# Patient Record
Sex: Female | Born: 1960 | Hispanic: No | Smoking: Never smoker
Health system: Southern US, Community
[De-identification: ages and names within clinical notes are randomized; demographics above are authoritative.]

## PROBLEM LIST (undated history)

## (undated) DIAGNOSIS — R7303 Prediabetes: Secondary | ICD-10-CM

## (undated) DIAGNOSIS — K219 Gastro-esophageal reflux disease without esophagitis: Secondary | ICD-10-CM

## (undated) DIAGNOSIS — D259 Leiomyoma of uterus, unspecified: Secondary | ICD-10-CM

## (undated) DIAGNOSIS — N950 Postmenopausal bleeding: Secondary | ICD-10-CM

## (undated) DIAGNOSIS — Z9229 Personal history of other drug therapy: Secondary | ICD-10-CM

## (undated) HISTORY — DX: Prediabetes: R73.03

## (undated) HISTORY — PX: COLONOSCOPY: SHX174

## (undated) HISTORY — DX: Postmenopausal bleeding: N95.0

## (undated) HISTORY — DX: Personal history of other drug therapy: Z92.29

## (undated) HISTORY — PX: OTHER SURGICAL HISTORY: SHX169

## (undated) HISTORY — PX: APPENDECTOMY: SHX54

## (undated) HISTORY — DX: Leiomyoma of uterus, unspecified: D25.9

---

## 2000-04-14 ENCOUNTER — Other Ambulatory Visit: Admission: RE | Admit: 2000-04-14 | Discharge: 2000-04-14 | Payer: Self-pay | Admitting: Obstetrics & Gynecology

## 2001-08-20 ENCOUNTER — Other Ambulatory Visit: Admission: RE | Admit: 2001-08-20 | Discharge: 2001-08-20 | Payer: Self-pay | Admitting: Obstetrics & Gynecology

## 2002-10-25 ENCOUNTER — Other Ambulatory Visit: Admission: RE | Admit: 2002-10-25 | Discharge: 2002-10-25 | Payer: Self-pay | Admitting: Obstetrics & Gynecology

## 2003-10-28 ENCOUNTER — Other Ambulatory Visit: Admission: RE | Admit: 2003-10-28 | Discharge: 2003-10-28 | Payer: Self-pay | Admitting: Obstetrics & Gynecology

## 2005-01-11 ENCOUNTER — Other Ambulatory Visit: Admission: RE | Admit: 2005-01-11 | Discharge: 2005-01-11 | Payer: Self-pay | Admitting: Obstetrics & Gynecology

## 2014-05-05 ENCOUNTER — Encounter (HOSPITAL_COMMUNITY): Payer: Self-pay | Admitting: *Deleted

## 2014-05-06 ENCOUNTER — Other Ambulatory Visit: Payer: Self-pay | Admitting: Obstetrics and Gynecology

## 2014-05-08 ENCOUNTER — Encounter (HOSPITAL_COMMUNITY): Payer: BC Managed Care – PPO | Admitting: Anesthesiology

## 2014-05-08 ENCOUNTER — Ambulatory Visit (HOSPITAL_COMMUNITY)
Admission: RE | Admit: 2014-05-08 | Discharge: 2014-05-08 | Disposition: A | Discharge status: Home / Self Care | Payer: BC Managed Care – PPO | Source: Ambulatory Visit | Attending: Obstetrics and Gynecology | Admitting: Obstetrics and Gynecology

## 2014-05-08 ENCOUNTER — Encounter (HOSPITAL_COMMUNITY): Payer: Self-pay | Admitting: *Deleted

## 2014-05-08 ENCOUNTER — Encounter (HOSPITAL_COMMUNITY)
Admission: RE | Disposition: A | Discharge status: Home / Self Care | Payer: Self-pay | Source: Ambulatory Visit | Attending: Obstetrics and Gynecology

## 2014-05-08 ENCOUNTER — Ambulatory Visit (HOSPITAL_COMMUNITY): Payer: BC Managed Care – PPO | Admitting: Anesthesiology

## 2014-05-08 DIAGNOSIS — K219 Gastro-esophageal reflux disease without esophagitis: Secondary | ICD-10-CM | POA: Insufficient documentation

## 2014-05-08 DIAGNOSIS — R05 Cough: Secondary | ICD-10-CM | POA: Insufficient documentation

## 2014-05-08 DIAGNOSIS — R059 Cough, unspecified: Secondary | ICD-10-CM | POA: Insufficient documentation

## 2014-05-08 DIAGNOSIS — N950 Postmenopausal bleeding: Secondary | ICD-10-CM | POA: Insufficient documentation

## 2014-05-08 DIAGNOSIS — D259 Leiomyoma of uterus, unspecified: Secondary | ICD-10-CM

## 2014-05-08 DIAGNOSIS — N882 Stricture and stenosis of cervix uteri: Secondary | ICD-10-CM | POA: Insufficient documentation

## 2014-05-08 HISTORY — DX: Gastro-esophageal reflux disease without esophagitis: K21.9

## 2014-05-08 HISTORY — PX: HYSTEROSCOPY W/D&C: SHX1775

## 2014-05-08 LAB — CBC
HEMATOCRIT: 39.6 % (ref 36.0–46.0)
Hemoglobin: 13 g/dL (ref 12.0–15.0)
MCH: 30.4 pg (ref 26.0–34.0)
MCHC: 32.8 g/dL (ref 30.0–36.0)
MCV: 92.5 fL (ref 78.0–100.0)
Platelets: 278 10*3/uL (ref 150–400)
RBC: 4.28 MIL/uL (ref 3.87–5.11)
RDW: 12.1 % (ref 11.5–15.5)
WBC: 6.6 10*3/uL (ref 4.0–10.5)

## 2014-05-08 SURGERY — DILATATION AND CURETTAGE /HYSTEROSCOPY
Anesthesia: General

## 2014-05-08 MED ORDER — OXYCODONE HCL 5 MG/5ML PO SOLN: 5.0000 mg | Freq: Once | ORAL | Status: DC | PRN

## 2014-05-08 MED ORDER — LACTATED RINGERS IV SOLN
INTRAVENOUS | Status: DC
  Administered 2014-05-08 (×2): via INTRAVENOUS

## 2014-05-08 MED ORDER — MIDAZOLAM HCL 2 MG/2ML IJ SOLN
INTRAMUSCULAR | Status: DC | PRN
  Administered 2014-05-08: 2 mg via INTRAVENOUS

## 2014-05-08 MED ORDER — KETOROLAC TROMETHAMINE 30 MG/ML IJ SOLN
INTRAMUSCULAR | Status: DC | PRN
  Administered 2014-05-08: 30 mg via INTRAVENOUS

## 2014-05-08 MED ORDER — ONDANSETRON HCL 4 MG/2ML IJ SOLN
INTRAMUSCULAR | Status: DC | PRN
  Administered 2014-05-08: 4 mg via INTRAVENOUS

## 2014-05-08 MED ORDER — OXYCODONE HCL 5 MG PO TABS: 5.0000 mg | ORAL_TABLET | Freq: Once | ORAL | Status: DC | PRN

## 2014-05-08 MED ORDER — ACETAMINOPHEN 325 MG PO TABS: 325.0000 mg | ORAL_TABLET | ORAL | Status: DC | PRN

## 2014-05-08 MED ORDER — LIDOCAINE HCL (CARDIAC) 20 MG/ML IV SOLN
INTRAVENOUS | Status: DC | PRN
  Administered 2014-05-08: 80 mg via INTRAVENOUS

## 2014-05-08 MED ORDER — PROPOFOL 10 MG/ML IV EMUL
INTRAVENOUS | Status: AC
  Filled 2014-05-08: qty 20

## 2014-05-08 MED ORDER — MIDAZOLAM HCL 2 MG/2ML IJ SOLN
INTRAMUSCULAR | Status: AC
  Filled 2014-05-08: qty 2

## 2014-05-08 MED ORDER — GLYCINE 1.5 % IR SOLN
Status: DC | PRN
  Administered 2014-05-08: 3000 mL

## 2014-05-08 MED ORDER — KETOROLAC TROMETHAMINE 30 MG/ML IJ SOLN
INTRAMUSCULAR | Status: AC
  Filled 2014-05-08: qty 1

## 2014-05-08 MED ORDER — FENTANYL CITRATE 0.05 MG/ML IJ SOLN: 25.0000 ug | INTRAMUSCULAR | Status: DC | PRN

## 2014-05-08 MED ORDER — ACETAMINOPHEN 160 MG/5ML PO SOLN: 325.0000 mg | ORAL | Status: DC | PRN

## 2014-05-08 MED ORDER — FENTANYL CITRATE 0.05 MG/ML IJ SOLN
INTRAMUSCULAR | Status: DC | PRN
  Administered 2014-05-08 (×2): 50 ug via INTRAVENOUS

## 2014-05-08 MED ORDER — DEXAMETHASONE SODIUM PHOSPHATE 10 MG/ML IJ SOLN
INTRAMUSCULAR | Status: AC
  Filled 2014-05-08: qty 1

## 2014-05-08 MED ORDER — ONDANSETRON HCL 4 MG/2ML IJ SOLN
INTRAMUSCULAR | Status: AC
  Filled 2014-05-08: qty 2

## 2014-05-08 MED ORDER — DEXAMETHASONE SODIUM PHOSPHATE 10 MG/ML IJ SOLN
INTRAMUSCULAR | Status: DC | PRN
  Administered 2014-05-08: 10 mg via INTRAVENOUS

## 2014-05-08 MED ORDER — ONDANSETRON HCL 4 MG/2ML IJ SOLN: 4.0000 mg | Freq: Once | INTRAMUSCULAR | Status: DC | PRN

## 2014-05-08 MED ORDER — LIDOCAINE HCL 1 % IJ SOLN
INTRAMUSCULAR | Status: AC
  Filled 2014-05-08: qty 20

## 2014-05-08 MED ORDER — HYDROCODONE-IBUPROFEN 5-200 MG PO TABS: 1.0000 | ORAL_TABLET | Freq: Three times a day (TID) | ORAL | Status: AC | PRN

## 2014-05-08 MED ORDER — FENTANYL CITRATE 0.05 MG/ML IJ SOLN
INTRAMUSCULAR | Status: AC
  Filled 2014-05-08: qty 2

## 2014-05-08 MED ORDER — LIDOCAINE HCL (CARDIAC) 20 MG/ML IV SOLN
INTRAVENOUS | Status: AC
  Filled 2014-05-08: qty 5

## 2014-05-08 MED ORDER — CEFAZOLIN SODIUM-DEXTROSE 2-3 GM-% IV SOLR
INTRAVENOUS | Status: AC
  Filled 2014-05-08: qty 50

## 2014-05-08 MED ORDER — CEFAZOLIN SODIUM-DEXTROSE 2-3 GM-% IV SOLR: 2.0000 g | Freq: Once | INTRAVENOUS | Status: DC

## 2014-05-08 MED ORDER — PROPOFOL 10 MG/ML IV BOLUS
INTRAVENOUS | Status: DC | PRN
  Administered 2014-05-08: 100 mg via INTRAVENOUS

## 2014-05-08 SURGICAL SUPPLY — 16 items
CANISTER SUCT 3000ML (MISCELLANEOUS) ×3 IMPLANT
CATH ROBINSON RED A/P 16FR (CATHETERS) ×3 IMPLANT
CLOTH BEACON ORANGE TIMEOUT ST (SAFETY) ×3 IMPLANT
CONTAINER PREFILL 10% NBF 60ML (FORM) ×6 IMPLANT
DRAPE HYSTEROSCOPY (DRAPE) ×3 IMPLANT
ELECT REM PT RETURN 9FT ADLT (ELECTROSURGICAL) ×3
ELECTRODE REM PT RTRN 9FT ADLT (ELECTROSURGICAL) ×1 IMPLANT
GLOVE ECLIPSE 7.0 STRL STRAW (GLOVE) ×6 IMPLANT
GOWN STRL REUS W/TWL LRG LVL3 (GOWN DISPOSABLE) ×9 IMPLANT
LOOP ANGLED CUTTING 22FR (CUTTING LOOP) IMPLANT
PACK VAGINAL MINOR WOMEN LF (CUSTOM PROCEDURE TRAY) ×3 IMPLANT
PAD OB MATERNITY 4.3X12.25 (PERSONAL CARE ITEMS) ×3 IMPLANT
SET TUBING HYSTEROSCOPY 2 NDL (TUBING) ×3 IMPLANT
TOWEL OR 17X24 6PK STRL BLUE (TOWEL DISPOSABLE) ×6 IMPLANT
TUBE HYSTEROSCOPY W Y-CONNECT (TUBING) ×3 IMPLANT
WATER STERILE IRR 1000ML POUR (IV SOLUTION) ×3 IMPLANT

## 2014-05-08 NOTE — Anesthesia Preprocedure Evaluation (Addendum)
Anesthesia Evaluation  Patient identified by MRN, date of birth, ID band Patient awake    Reviewed: Allergy & Precautions, H&P , NPO status , Patient's Chart, lab work & pertinent test results  History of Anesthesia Complications Negative for: history of anesthetic complications  Airway Mallampati: III TM Distance: >3 FB Neck ROM: Full    Dental  (+) Teeth Intact   Pulmonary  Chronic cough, seasonal allergies, takes Claritin and zyrtec per daughter breath sounds clear to auscultation        Cardiovascular negative cardio ROS  Rhythm:Regular     Neuro/Psych negative neurological ROS     GI/Hepatic Neg liver ROS, GERD-  Medicated and Controlled,  Endo/Other  negative endocrine ROS  Renal/GU negative Renal ROS     Musculoskeletal negative musculoskeletal ROS (+)   Abdominal   Peds  Hematology negative hematology ROS (+)   Anesthesia Other Findings   Reproductive/Obstetrics                           Anesthesia Physical Anesthesia Plan  ASA: II  Anesthesia Plan: General   Post-op Pain Management:    Induction: Intravenous  Airway Management Planned: LMA  Additional Equipment: None  Intra-op Plan:   Post-operative Plan:   Informed Consent: I have reviewed the patients History and Physical, chart, labs and discussed the procedure including the risks, benefits and alternatives for the proposed anesthesia with the patient or authorized representative who has indicated his/her understanding and acceptance.   Dental advisory given  Plan Discussed with: CRNA and Surgeon  Anesthesia Plan Comments:        Anesthesia Quick Evaluation

## 2014-05-08 NOTE — Discharge Instructions (Signed)
DISCHARGE INSTRUCTIONS: HYSTEROSCOPY  The following instructions have been prepared to help you care for yourself upon your return home.  No Ibuprofen containing products (ie Advil, Aleve, Motrin, etc.) until after 2:00 pm today.  Personal hygiene:  Use sanitary pads for vaginal drainage, not tampons.  Shower the day after your procedure.  NO tub baths, pools or Jacuzzis for 2-3 weeks.  Wipe front to back after using the bathroom.  Activity and limitations:  Do NOT drive or operate any equipment for 24 hours. The effects of anesthesia are still present and drowsiness may result.  Do NOT rest in bed all day.  Walking is encouraged.  Walk up and down stairs slowly.  You may resume your normal activity in one to two days or as indicated by your physician. Sexual activity: NO intercourse for at least 2 weeks after the procedure, or as indicated by your Doctor.  Diet: Eat a light meal as desired this evening. You may resume your usual diet tomorrow.  Return to Work: You may resume your work activities in one to two days or as indicated by Marine scientist.  What to expect after your surgery: Expect to have vaginal bleeding/discharge for 2-3 days and spotting for up to 10 days. It is not unusual to have soreness for up to 1-2 weeks. You may have a slight burning sensation when you urinate for the first day. Mild cramps may continue for a couple of days. You may have a regular period in 2-6 weeks.  Call your doctor for any of the following:  Excessive vaginal bleeding or clotting, saturating and changing one pad every hour.  Inability to urinate 6 hours after discharge from hospital.  Pain not relieved by pain medication.  Fever of 100.4 F or greater.  Unusual vaginal discharge or odor.  Return to office _________________Call for an appointment ___________________ Patients signature: ______________________ Nurses signature ________________________  [place]  [facility] [phone]

## 2014-05-08 NOTE — Transfer of Care (Signed)
Immediate Anesthesia Transfer of Care Note  Patient: Kathryn Thompson  Procedure(s) Performed: Procedure(s): DILATATION AND CURETTAGE /HYSTEROSCOPY (N/A)  Patient Location: PACU  Anesthesia Type:General  Level of Consciousness: awake, alert  and oriented  Airway & Oxygen Therapy: Patient Spontanous Breathing and Patient connected to nasal cannula oxygen  Post-op Assessment: Report given to PACU RN, Post -op Vital signs reviewed and stable and Patient moving all extremities  Post vital signs: Reviewed and stable  Complications: No apparent anesthesia complications

## 2014-05-08 NOTE — Op Note (Signed)
NAME:  Kathryn Thompson, OVERALL NO.:  [id number]  MEDICAL RECORD NO.:  [id number]  LOCATION:  [facility]                          FACILITY:  [facility]  PHYSICIAN:  Freda Munro, M.D.    DATE OF BIRTH:  [birth year]  DATE OF PROCEDURE:  05/08/2014 DATE OF DISCHARGE:                              OPERATIVE REPORT   PREOPERATIVE DIAGNOSES: 1. Postmenopausal bleeding. 2. Uterine fibroids.  POSTOPERATIVE DIAGNOSES: 1. Postmenopausal bleeding. 2. Uterine fibroids.  PROCEDURE: 1. Hysteroscopy. 2. Dilation and curettage.  SURGEON:  Freda Munro, M.D.  ANESTHESIA:  General.  ANTIBIOTICS:  Ancef 2 g.  DRAINS:  Red rubber catheter in the bladder.  SPECIMENS:  Endometrial curettings sent to pathology.  COMPLICATIONS:  None.  DESCRIPTION OF PROCEDURE:  The patient was taken to the operative suite, where general anesthetic was administered without difficulty.  She was then placed in dorsal lithotomy position.  She was prepped and draped in usual fashion for this procedure.  A sterile speculum was placed in the vagina.  A single-tooth tenaculum was applied to the anterior cervical lip.  The cervical os was serially dilated.  It was stenotic.  A hysteroscope was placed into the cervix to confirm proper location of dilation.  When this was performed, it was noted that the false passage was being treated with the dilators posterior to the os.  Once this was discovered, the hysteroscope was diverted more anterior and into the uterine cavity where both ostia were visualized.  There were no evidence of any polyps or submucous fibroids.  At this point, the hysteroscope was removed and a sharp curettage was performed.  There was a minimal amount of tissue noted.  The hysteroscope was then placed back into the uterine cavity to confirm placement of the curettage, and it was confirmed that the uterine cavity was curetted.  At this point, the procedure was concluded.  The patient was taken to  the recovery room in stable condition.  Instrument and lap count was correct x2.  The patient will be discharged to home.  She will be sent home with Percocet to take p.r.n.  She will follow up in the office in 4 weeks.          ______________________________ Freda Munro, M.D.     MA/MEDQ  D:  05/08/2014  T:  05/08/2014  Job:  [id number]

## 2014-05-08 NOTE — H&P (Signed)
Pt is a 53 y/o oriental female who presents to the OR for a D&C/Hysteroscopy for postmenopausal bleeding with fibroids. She also appears to have a tickened endometrium on u/s. HPI:  Past Medical History  Diagnosis Date  . Medical history non-contributory   . GERD (gastroesophageal reflux disease)   . Vaginal delivery [place]    Past Surgical History  Procedure Laterality Date  . Appendectomy    . No past surgeries      History reviewed. No pertinent family history. Social History:  reports that she has never smoked. She has never used smokeless tobacco. She reports that she does not drink alcohol or use illicit drugs.  Allergies: Not on File  No prescriptions prior to admission       Blood pressure 121/61, pulse 57, temperature 98.4 F (36.9 C), temperature source Oral, resp. rate 16, height 5\' 3"  (1.6 m), weight 53.524 kg (118 lb), last menstrual period 04/29/2014, SpO2 100.00%. General appearance: alert and cooperative Lungs: clear to auscultation bilaterally Heart: regular rate and rhythm, S1, S2 normal, no murmur, click, rub or gallop Abdomen: soft, non-tender; bowel sounds normal; no masses,  no organomegaly Pelvic- defered to the or.   Lab Results  Component Value Date   WBC 6.6 05/08/2014   HGB 13.0 05/08/2014   HCT 39.6 05/08/2014   MCV 92.5 05/08/2014   PLT 278 05/08/2014   No results found for this basename: PREGTESTUR, PREGSERUM, HCG, HCGQUANT     Assessment/Plan   Imp/ Post menopausal bleeding         Fibroids Plan/ Hysteroscopy and D&C  ANDERSON,MARK E 05/08/2014, 7:25 AM

## 2014-05-08 NOTE — Anesthesia Postprocedure Evaluation (Signed)
  Anesthesia Post-op Note  Anesthesia Post Note  Patient: Kathryn Thompson  Procedure(s) Performed: Procedure(s) (LRB): DILATATION AND CURETTAGE /HYSTEROSCOPY (N/A)  Anesthesia type: General  Patient location: PACU  Post pain: Pain level controlled  Post assessment: Post-op Vital signs reviewed  Last Vitals:  Filed Vitals:   05/08/14 0900  BP: 136/76  Pulse: 56  Temp: 36.5 C  Resp: 17    Post vital signs: Reviewed  Level of consciousness: sedated  Complications: No apparent anesthesia complications

## 2014-05-10 ENCOUNTER — Encounter (HOSPITAL_COMMUNITY): Payer: Self-pay | Admitting: Obstetrics and Gynecology

## 2015-03-23 ENCOUNTER — Other Ambulatory Visit (HOSPITAL_COMMUNITY): Payer: Self-pay | Admitting: Obstetrics and Gynecology

## 2015-06-22 ENCOUNTER — Encounter (HOSPITAL_BASED_OUTPATIENT_CLINIC_OR_DEPARTMENT_OTHER): Payer: Self-pay | Admitting: *Deleted

## 2015-06-22 ENCOUNTER — Emergency Department (HOSPITAL_BASED_OUTPATIENT_CLINIC_OR_DEPARTMENT_OTHER): Payer: 59

## 2015-06-22 ENCOUNTER — Emergency Department (HOSPITAL_BASED_OUTPATIENT_CLINIC_OR_DEPARTMENT_OTHER)
Admission: EM | Admit: 2015-06-22 | Discharge: 2015-06-23 | Disposition: A | Discharge status: Home / Self Care | Payer: 59 | Attending: Emergency Medicine | Admitting: Emergency Medicine

## 2015-06-22 DIAGNOSIS — Z8719 Personal history of other diseases of the digestive system: Secondary | ICD-10-CM | POA: Diagnosis not present

## 2015-06-22 DIAGNOSIS — S4991XA Unspecified injury of right shoulder and upper arm, initial encounter: Secondary | ICD-10-CM | POA: Diagnosis not present

## 2015-06-22 DIAGNOSIS — Y998 Other external cause status: Secondary | ICD-10-CM | POA: Insufficient documentation

## 2015-06-22 DIAGNOSIS — W010XXA Fall on same level from slipping, tripping and stumbling without subsequent striking against object, initial encounter: Secondary | ICD-10-CM | POA: Diagnosis not present

## 2015-06-22 DIAGNOSIS — Y92511 Restaurant or cafe as the place of occurrence of the external cause: Secondary | ICD-10-CM | POA: Insufficient documentation

## 2015-06-22 DIAGNOSIS — W19XXXA Unspecified fall, initial encounter: Secondary | ICD-10-CM

## 2015-06-22 DIAGNOSIS — G8929 Other chronic pain: Secondary | ICD-10-CM | POA: Diagnosis not present

## 2015-06-22 DIAGNOSIS — Y9389 Activity, other specified: Secondary | ICD-10-CM | POA: Diagnosis not present

## 2015-06-22 NOTE — ED Notes (Signed)
Pt. Had a fall causing injury to the R upper arm.  No deformity noted.  Pt. Has had pain meds due to c/o pain

## 2015-06-22 NOTE — ED Provider Notes (Signed)
CSN: [id number]     Arrival date & time 06/22/15  2234 History   First MD Initiated Contact with Patient 06/22/15 2237     Chief Complaint  Patient presents with  . Arm Injury    R upper arm     (Consider location/radiation/quality/duration/timing/severity/associated sxs/prior Treatment) Patient is a 54 y.o. female presenting with arm injury. The history is provided by the patient and a relative. No language interpreter was used.  Arm Injury Associated symptoms: no neck pain   Kathryn Thompson is a 54 year old female who presents for right shoulder pain after trip and fall while coming out of [employer] just prior to arrival. She was given fentanyl 100 mg by EMS in route. Pain is worse with reaching out into the side. Relieved by rest. She had a cortisone injection last week in the right shoulder for chronic right shoulder pain. No loss of consciousness or head injury. She denies any numbness or tingling in the arm. Her daughter is the Optometrist. Patient refusing pain medication at this time.     Past Medical History  Diagnosis Date  . GERD (gastroesophageal reflux disease)    Past Surgical History  Procedure Laterality Date  . Appendectomy    . Hemroidectomy     No family history on file. History  Substance Use Topics  . Smoking status: Never Smoker   . Smokeless tobacco: Not on file  . Alcohol Use: No   OB History    No data available     Review of Systems  Musculoskeletal: Positive for myalgias and arthralgias. Negative for neck pain.  Skin: Negative for wound.  Neurological: Negative for dizziness, syncope, weakness and numbness.      Allergies  Aleve  Home Medications   Prior to Admission medications   Medication Sig Start Date End Date Taking? Authorizing Provider  methocarbamol (ROBAXIN) 500 MG tablet Take 1 tablet (500 mg total) by mouth 2 (two) times daily. 06/23/15   Tametria Aho Patel-Mills, PA-C  oxyCODONE-acetaminophen (PERCOCET/ROXICET) 5-325 MG per tablet  Take 2 tablets by mouth every 4 (four) hours as needed for severe pain. 06/23/15   Alfons Sulkowski Patel-Mills, PA-C   BP 130/61 mmHg  Pulse 71  Temp(Src) 98.1 F (36.7 C) (Oral)  Resp 16  Ht 5\' 3"  (1.6 m)  Wt 108 lb (48.988 kg)  BMI 19.14 kg/m2  SpO2 99% Physical Exam  Constitutional: She is oriented to person, place, and time. She appears well-developed and well-nourished.  HENT:  Head: Normocephalic.  Eyes: Conjunctivae are normal.  Neck: Normal range of motion.  Cardiovascular: Normal rate.   Pulmonary/Chest: Effort normal.  Abdominal: Soft.  Musculoskeletal: She exhibits no edema or tenderness.  Patient has no deformity of the shoulder or elbow. No clavicle deformity. She is able to lift right arm to 90 without pain. 2+ radial pulse. She is able to flex and extend all fingers without difficulty. She has no ecchymosis or redness of the shoulder.  Neurological: She is alert and oriented to person, place, and time.  Skin: Skin is warm and dry.  Psychiatric: She has a normal mood and affect. Her behavior is normal.  Nursing note and vitals reviewed.   ED Course  Procedures (including critical care time) Labs Review Labs Reviewed - No data to display  Imaging Review Dg Shoulder Right  06/23/2015   CLINICAL DATA:  Initial evaluation for acute trauma, fall.  EXAM: RIGHT SHOULDER - 2+ VIEW  COMPARISON:  None.  FINDINGS: There is no evidence of fracture  or dislocation. There is no evidence of arthropathy or other focal bone abnormality. Soft tissues are unremarkable.  IMPRESSION: No acute fracture or dislocation.   Electronically Signed   By: Jeannine Boga M.D.   On: 06/23/2015 00:22     EKG Interpretation None      MDM   Final diagnoses:  Right shoulder injury, initial encounter  Fall, initial encounter   Patient presents for right shoulder pain after she tripped and fall. X-ray of right shoulder shows no acute fracture or dislocation. Patient was put in the right arm sling.  During the duration of her visit she was asked if she wanted pain medication and refused. She received 100mg  of fentanyl en route by EMS. Upon discharge patient states she is in pain and was given Percocet. She was given Percocet and Robaxin. I discussed RICE as well as orthopedic or primary care follow-up for possible PT if she continued to have pain. Daughter as well as patient verbally agreed with the plan. Patient requested work note for 2 days which was provided.  Ottie Glazier, PA-C 06/23/15 [id number]  April Palumbo, MD 06/23/15 [phone]

## 2015-06-22 NOTE — ED Notes (Addendum)
Pt fell at [employer]. Complains of Right upper arm pain. Passive range of motion good

## 2015-06-23 MED ORDER — OXYCODONE-ACETAMINOPHEN 5-325 MG PO TABS
1.0000 | ORAL_TABLET | Freq: Once | ORAL | Status: AC
  Administered 2015-06-23: 1 via ORAL
  Filled 2015-06-23: qty 1

## 2015-06-23 MED ORDER — OXYCODONE-ACETAMINOPHEN 5-325 MG PO TABS: 2.0000 | ORAL_TABLET | ORAL | Status: AC | PRN

## 2015-06-23 MED ORDER — METHOCARBAMOL 500 MG PO TABS: 500.0000 mg | ORAL_TABLET | Freq: Two times a day (BID) | ORAL | Status: AC

## 2015-06-23 NOTE — ED Notes (Signed)
Gave patient ice pack for right arm/shoulder

## 2015-06-23 NOTE — Discharge Instructions (Signed)
Fall Prevention and Home Safety Follow-up with your primary care physician. Take naproxen for pain. Rest. Ice. Elevate. Falls cause injuries and can affect all age groups. It is possible to prevent falls.  HOW TO PREVENT FALLS  Wear shoes with rubber soles that do not have an opening for your toes.  Keep the inside and outside of your house well lit.  Use night lights throughout your home.  Remove clutter from floors.  Clean up floor spills.  Remove throw rugs or fasten them to the floor with carpet tape.  Do not place electrical cords across pathways.  Put grab bars by your tub, shower, and toilet. Do not use towel bars as grab bars.  Put handrails on both sides of the stairway. Fix loose handrails.  Do not climb on stools or stepladders, if possible.  Do not wax your floors.  Repair uneven or unsafe sidewalks, walkways, or stairs.  Keep items you use a lot within reach.  Be aware of pets.  Keep emergency numbers next to the telephone.  Put smoke detectors in your home and near bedrooms. Ask your doctor what other things you can do to prevent falls. Document Released: 08/27/2009 Document Revised: 05/01/2012 Document Reviewed: 01/31/2012 [facility] Patient Information 2015 [place], [place]. This information is not intended to replace advice given to you by your health care provider. Make sure you discuss any questions you have with your health care provider.

## 2015-07-27 ENCOUNTER — Encounter: Payer: Self-pay | Admitting: Family Medicine

## 2015-07-27 ENCOUNTER — Telehealth: Payer: Self-pay | Admitting: Family Medicine

## 2015-07-27 ENCOUNTER — Ambulatory Visit (INDEPENDENT_AMBULATORY_CARE_PROVIDER_SITE_OTHER): Payer: 59 | Admitting: Family Medicine

## 2015-07-27 VITALS — BP 126/81 | HR 56 | Temp 98.1°F | Resp 16 | Ht 61.0 in | Wt 108.0 lb

## 2015-07-27 DIAGNOSIS — M25511 Pain in right shoulder: Secondary | ICD-10-CM | POA: Diagnosis not present

## 2015-07-27 DIAGNOSIS — R634 Abnormal weight loss: Secondary | ICD-10-CM | POA: Insufficient documentation

## 2015-07-27 DIAGNOSIS — R7309 Other abnormal glucose: Secondary | ICD-10-CM | POA: Diagnosis not present

## 2015-07-27 DIAGNOSIS — K219 Gastro-esophageal reflux disease without esophagitis: Secondary | ICD-10-CM | POA: Diagnosis not present

## 2015-07-27 DIAGNOSIS — R7303 Prediabetes: Secondary | ICD-10-CM | POA: Insufficient documentation

## 2015-07-27 LAB — CBC WITH DIFFERENTIAL/PLATELET
BASOS PCT: 0.4 % (ref 0.0–3.0)
Basophils Absolute: 0 10*3/uL (ref 0.0–0.1)
EOS ABS: 0.1 10*3/uL (ref 0.0–0.7)
Eosinophils Relative: 1.7 % (ref 0.0–5.0)
HCT: 38.1 % (ref 36.0–46.0)
Hemoglobin: 12.7 g/dL (ref 12.0–15.0)
Lymphocytes Relative: 31.2 % (ref 12.0–46.0)
Lymphs Abs: 1.8 10*3/uL (ref 0.7–4.0)
MCHC: 33.3 g/dL (ref 30.0–36.0)
MCV: 90.5 fl (ref 78.0–100.0)
MONO ABS: 0.2 10*3/uL (ref 0.1–1.0)
Monocytes Relative: 4.2 % (ref 3.0–12.0)
NEUTROS PCT: 62.5 % (ref 43.0–77.0)
Neutro Abs: 3.6 10*3/uL (ref 1.4–7.7)
Platelets: 293 10*3/uL (ref 150.0–400.0)
RBC: 4.2 Mil/uL (ref 3.87–5.11)
RDW: 13 % (ref 11.5–15.5)
WBC: 5.8 10*3/uL (ref 4.0–10.5)

## 2015-07-27 LAB — COMPREHENSIVE METABOLIC PANEL
ALT: 12 U/L (ref 0–35)
AST: 17 U/L (ref 0–37)
Albumin: 4.3 g/dL (ref 3.5–5.2)
Alkaline Phosphatase: 72 U/L (ref 39–117)
BUN: 14 mg/dL (ref 6–23)
CO2: 30 meq/L (ref 19–32)
Calcium: 9.5 mg/dL (ref 8.4–10.5)
Chloride: 103 mEq/L (ref 96–112)
Creatinine, Ser: 0.51 mg/dL (ref 0.40–1.20)
GFR: 132.94 mL/min (ref >60.00)
GLUCOSE: 101 mg/dL — AB (ref 70–99)
POTASSIUM: 4.2 meq/L (ref 3.5–5.1)
Sodium: 141 mEq/L (ref 135–145)
Total Bilirubin: 0.9 mg/dL (ref 0.2–1.2)
Total Protein: 7.4 g/dL (ref 6.0–8.3)

## 2015-07-27 LAB — LIPID PANEL
CHOL/HDL RATIO: 3
Cholesterol: 202 mg/dL — ABNORMAL HIGH (ref 0–200)
HDL: 72.4 mg/dL (ref >39.00)
LDL CALC: 115 mg/dL — AB (ref 0–99)
NONHDL: 130.08
TRIGLYCERIDES: 77 mg/dL (ref 0.0–149.0)
VLDL: 15.4 mg/dL (ref 0.0–40.0)

## 2015-07-27 LAB — TSH: TSH: 1.03 u[IU]/mL (ref 0.35–4.50)

## 2015-07-27 LAB — HEMOGLOBIN A1C: HEMOGLOBIN A1C: 6.1 % (ref 4.6–6.5)

## 2015-07-27 MED ORDER — CYCLOBENZAPRINE HCL 5 MG PO TABS: 5.0000 mg | ORAL_TABLET | Freq: Three times a day (TID) | ORAL | Status: AC | PRN

## 2015-07-27 NOTE — Telephone Encounter (Signed)
Pls call pt: Will need to speak to daughter/family member since she does not speak [place].  - Her labs look good. Her a1c is mildly elevated (6.1), this is still pre-diabetic and she should continue to follow a low sugar/carbohydrate diet, and exercise daily to help improve her a1c. I do not feel she needs medication unless this raises on her next check in 3-6 months.  - Her cholesterol was very mildly elevated (115), exercise and diet will improve this as well. She could also start a fish oil supplement (OTC). We will check this again 61mos- 1year.  - Her next appt should be in 4 weeks for her shoulder. We can discuss results in more detail at that time if she has questions.

## 2015-07-27 NOTE — Patient Instructions (Signed)
We will obtain records and let you know about your labs when they are resulted. Depending on records and results of test, will define when I need to see you again.  I will need to follow up in 4 weeks for your shoulder after starting physical therapy to see if improvement and need of referral to orthopedics.   Health Maintenance Adopting a healthy lifestyle and getting preventive care can go a long way to promote health and wellness. Talk with your health care provider about what schedule of regular examinations is right for you. This is a good chance for you to check in with your provider about disease prevention and staying healthy. In between checkups, there are plenty of things you can do on your own. Experts have done a lot of research about which lifestyle changes and preventive measures are most likely to keep you healthy. Ask your health care provider for more information. WEIGHT AND DIET  Eat a healthy diet  Be sure to include plenty of vegetables, fruits, low-fat dairy products, and lean protein.  Do not eat a lot of foods high in solid fats, added sugars, or salt.  Get regular exercise. This is one of the most important things you can do for your health.  Most adults should exercise for at least 150 minutes each week. The exercise should increase your heart rate and make you sweat (moderate-intensity exercise).  Most adults should also do strengthening exercises at least twice a week. This is in addition to the moderate-intensity exercise.  Maintain a healthy weight  Body mass index (BMI) is a measurement that can be used to identify possible weight problems. It estimates body fat based on height and weight. Your health care provider can help determine your BMI and help you achieve or maintain a healthy weight.  For females 63 years of age and older:   A BMI below 18.5 is considered underweight.  A BMI of 18.5 to 24.9 is normal.  A BMI of 25 to 29.9 is considered  overweight.  A BMI of 30 and above is considered obese.  Watch levels of cholesterol and blood lipids  You should start having your blood tested for lipids and cholesterol at 55 years of age, then have this test every 5 years.  You may need to have your cholesterol levels checked more often if:  Your lipid or cholesterol levels are high.  You are older than 55 years of age.  You are at high risk for heart disease.  CANCER SCREENING   Lung Cancer  Lung cancer screening is recommended for adults 66-36 years old who are at high risk for lung cancer because of a history of smoking.  A yearly low-dose CT scan of the lungs is recommended for people who:  Currently smoke.  Have quit within the past 15 years.  Have at least a 30-pack-year history of smoking. A pack year is smoking an average of one pack of cigarettes a day for 1 year.  Yearly screening should continue until it has been 15 years since you quit.  Yearly screening should stop if you develop a health problem that would prevent you from having lung cancer treatment.  Breast Cancer  Practice breast self-awareness. This means understanding how your breasts normally appear and feel.  It also means doing regular breast self-exams. Let your health care provider know about any changes, no matter how small.  If you are in your 20s or 30s, you should have a clinical breast exam (  care provider every 1-3 years as part of a regular health exam.  If you are 40 or older, have a CBE every year. Also consider having a breast X-ray (mammogram) every year.  If you have a family history of breast cancer, talk to your health care provider about genetic screening.  If you are at high risk for breast cancer, talk to your health care provider about having an MRI and a mammogram every year.  Breast cancer gene (BRCA) assessment is recommended for women who have family members with BRCA-related cancers. BRCA-related  cancers include:  Breast.  Ovarian.  Tubal.  Peritoneal cancers.  Results of the assessment will determine the need for genetic counseling and BRCA1 and BRCA2 testing. Cervical Cancer Routine pelvic examinations to screen for cervical cancer are no longer recommended for nonpregnant women who are considered low risk for cancer of the pelvic organs (ovaries, uterus, and vagina) and who do not have symptoms. A pelvic examination may be necessary if you have symptoms including those associated with pelvic infections. Ask your health care provider if a screening pelvic exam is right for you.   The Pap test is the screening test for cervical cancer for women who are considered at risk.  If you had a hysterectomy for a problem that was not cancer or a condition that could lead to cancer, then you no longer need Pap tests.  If you are older than 65 years, and you have had normal Pap tests for the past 10 years, you no longer need to have Pap tests.  If you have had past treatment for cervical cancer or a condition that could lead to cancer, you need Pap tests and screening for cancer for at least 20 years after your treatment.  If you no longer get a Pap test, assess your risk factors if they change (such as having a new sexual partner). This can affect whether you should start being screened again.  Some women have medical problems that increase their chance of getting cervical cancer. If this is the case for you, your health care provider may recommend more frequent screening and Pap tests.  The human papillomavirus (HPV) test is another test that may be used for cervical cancer screening. The HPV test looks for the virus that can cause cell changes in the cervix. The cells collected during the Pap test can be tested for HPV.  The HPV test can be used to screen women 30 years of age and older. Getting tested for HPV can extend the interval between normal Pap tests from three to five  years.  An HPV test also should be used to screen women of any age who have unclear Pap test results.  After 55 years of age, women should have HPV testing as often as Pap tests.  Colorectal Cancer  This type of cancer can be detected and often prevented.  Routine colorectal cancer screening usually begins at 55 years of age and continues through 55 years of age.  Your health care provider may recommend screening at an earlier age if you have risk factors for colon cancer.  Your health care provider may also recommend using home test kits to check for hidden blood in the stool.  A small camera at the end of a tube can be used to examine your colon directly (sigmoidoscopy or colonoscopy). This is done to check for the earliest forms of colorectal cancer.  Routine screening usually begins at age 50.  Direct examination of the   Direct examination of the colon should be repeated every 5-10 years through 55 years of age. However, you may need to be screened more often if early forms of precancerous polyps or small growths are found. Skin Cancer  Check your skin from head to toe regularly.  Tell your health care provider about any new moles or changes in moles, especially if there is a change in a mole's shape or color.  Also tell your health care provider if you have a mole that is larger than the size of a pencil eraser.  Always use sunscreen. Apply sunscreen liberally and repeatedly throughout the day.  Protect yourself by wearing long sleeves, pants, a wide-brimmed hat, and sunglasses whenever you are outside. HEART DISEASE, DIABETES, AND HIGH BLOOD PRESSURE   Have your blood pressure checked at least every 1-2 years. High blood pressure causes heart disease and increases the risk of stroke.  If you are between 55 years and 79 years old, ask your health care provider if you should take aspirin to prevent strokes.  Have regular diabetes screenings. This involves taking a blood sample to check your fasting  blood sugar level.  If you are at a normal weight and have a low risk for diabetes, have this test once every three years after 55 years of age.  If you are overweight and have a high risk for diabetes, consider being tested at a younger age or more often. PREVENTING INFECTION  Hepatitis B  If you have a higher risk for hepatitis B, you should be screened for this virus. You are considered at high risk for hepatitis B if:  You were born in a country where hepatitis B is common. Ask your health care provider which countries are considered high risk.  Your parents were born in a high-risk country, and you have not been immunized against hepatitis B (hepatitis B vaccine).  You have HIV or AIDS.  You use needles to inject street drugs.  You live with someone who has hepatitis B.  You have had sex with someone who has hepatitis B.  You get hemodialysis treatment.  You take certain medicines for conditions, including cancer, organ transplantation, and autoimmune conditions. Hepatitis C  Blood testing is recommended for:  Everyone born from 1945 through 1965.  Anyone with known risk factors for hepatitis C. Sexually transmitted infections (STIs)  You should be screened for sexually transmitted infections (STIs) including gonorrhea and chlamydia if:  You are sexually active and are younger than 55 years of age.  You are older than 55 years of age and your health care provider tells you that you are at risk for this type of infection.  Your sexual activity has changed since you were last screened and you are at an increased risk for chlamydia or gonorrhea. Ask your health care provider if you are at risk.  If you do not have HIV, but are at risk, it may be recommended that you take a prescription medicine daily to prevent HIV infection. This is called pre-exposure prophylaxis (PrEP). You are considered at risk if:  You are sexually active and do not regularly use condoms or know  the HIV status of your partner(s).  You take drugs by injection.  You are sexually active with a partner who has HIV. Talk with your health care provider about whether you are at high risk of being infected with HIV. If you choose to begin PrEP, you should first be tested for HIV. You should then be   tested every 3 months for as long as you are taking PrEP.  PREGNANCY   If you are premenopausal and you may become pregnant, ask your health care provider about preconception counseling.  If you may become pregnant, take 400 to 800 micrograms (mcg) of folic acid every day.  If you want to prevent pregnancy, talk to your health care provider about birth control (contraception). OSTEOPOROSIS AND MENOPAUSE   Osteoporosis is a disease in which the bones lose minerals and strength with aging. This can result in serious bone fractures. Your risk for osteoporosis can be identified using a bone density scan.  If you are 65 years of age or older, or if you are at risk for osteoporosis and fractures, ask your health care provider if you should be screened.  Ask your health care provider whether you should take a calcium or vitamin D supplement to lower your risk for osteoporosis.  Menopause may have certain physical symptoms and risks.  Hormone replacement therapy may reduce some of these symptoms and risks. Talk to your health care provider about whether hormone replacement therapy is right for you.  HOME CARE INSTRUCTIONS   Schedule regular health, dental, and eye exams.  Stay current with your immunizations.   Do not use any tobacco products including cigarettes, chewing tobacco, or electronic cigarettes.  If you are pregnant, do not drink alcohol.  If you are breastfeeding, limit how much and how often you drink alcohol.  Limit alcohol intake to no more than 1 drink per day for nonpregnant women. One drink equals 12 ounces of beer, 5 ounces of wine, or 1 ounces of hard liquor.  Do not  use street drugs.  Do not share needles.  Ask your health care provider for help if you need support or information about quitting drugs.  Tell your health care provider if you often feel depressed.  Tell your health care provider if you have ever been abused or do not feel safe at home. Document Released: 05/16/2011 Document Revised: 03/17/2014 Document Reviewed: 10/02/2013 [employer] Patient Information 2015 [employer]. This information is not intended to replace advice given to you by your health care provider. Make sure you discuss any questions you have with your health care provider.  

## 2015-07-27 NOTE — Progress Notes (Signed)
Pre visit review using our clinic review tool, if applicable. No additional management support is needed unless otherwise documented below in the visit note. 

## 2015-07-27 NOTE — Progress Notes (Signed)
Subjective:    Patient ID: Kathryn Thompson, female    DOB: Sep 05, 1961, 54 y.o.   MRN: [id number]  HPI  Patient presents for new patient establishment with complaints of right shoulder pain. She does not speak [place], and translated by her daughter.  Right Shoulder pain: Patient/daughter states that she was seen at the end of July 2016 for right shoulder pain, and given a cortisone shot. A few days after receiving the shot, she had a fall at a local hands restaurant that occurred on 06/22/2015. They state that she slipped and came down on her right shoulder, since then she has been experiencing shooting pains in her shoulder which is making work more difficult to manage. She points to the deltoid and supraspinatus location as the area of her discomfort. During the day her pain is approximately 6/10, but at night it wakes her up and is a 10/10. They report she was seen in the emergency room, although no medical records are available for this visit. They report that she did have x-rays of the shoulder, and which there was no fractures. Patient was prescribed a muscle relaxant and Vicodin for pain. She does not take the Vicodin frequently because it makes her drowsy. She does not feel the muscle relaxer helps her. Patient is allergic to naproxen. She is a Scientist, forensic.  Seasonal allergies: Patient has been affected by seasonal allergies, and takes Zyrtec daily. She feels is controlled on daily Zyrtec.  GERD: Patient has had GERD for over 10 years, she has had some dietary changes which seemed to be having a positive effect. She has been on a PPI intermittently for 10 years, and currently takes Prilosec when necessary.   Prediabetes: Patient states that she has a history of prediabetes, they're uncertain what her last A1c was. She states she was placed on metformin, but currently is not taking this medication because she had weight loss and she is concerned that the metformin was causing her weight loss. She  has strictly watch her dietary changes, and has lost weight. Her daughter thinks some of this was unintentional secondary to stress. She lost 12# with 2-3 months, after the dietary changes in the metformin, but the weight loss stopped last month, however she states she "cannot gain "get back.   Health maintenance:  Colonoscopy: Reports of recent normal colonoscopy, need medical records for date. No family history of colon cancer. Mammogram: Reports of normal mammogram in 2016, need medical records. No family history. Cervical cancer screening: Up-to-date, has OB/GYN. No past abnormal. Immunizations: Unknown immunization records, records have been requested Infectious disease screening: Unknown immunization records records have been requested  Past Medical History  Diagnosis Date  . Medical history non-contributory   . GERD (gastroesophageal reflux disease)   . Vaginal delivery 1980 , 1985, 1989, 1993   Allergies  Allergen Reactions  . Aleve [Naproxen Sodium] Hives   Past Surgical History  Procedure Laterality Date  . Appendectomy    . No past surgeries    . Hysteroscopy w/d&c N/A 05/08/2014    Procedure: DILATATION AND CURETTAGE /HYSTEROSCOPY;  Surgeon: Olga Millers, MD;  Location: [facility];  Service: Gynecology;  Laterality: N/A;   Family History  Problem Relation Age of Onset  . Breast cancer Paternal Aunt    Social History   Social History  . Marital Status: Unknown    Spouse Name: N/A  . Number of Children: N/A  . Years of Education: N/A   Occupational History  .  Not on file.   Social History Main Topics  . Smoking status: Never Smoker   . Smokeless tobacco: Never Used  . Alcohol Use: Yes     Comment: occassional   . Drug Use: No  . Sexual Activity: No   Other Topics Concern  . 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 Lives with D/S/H and brother family.    Married. Came from [place] in 1997. [place], speaking. Need translator.      Review of Systems Negative, with the exception of above mentioned in HPI     Objective:   Physical Exam BP 126/81 mmHg  Pulse 56  Temp(Src) 98.1 F (36.7 C) (Oral)  Resp 16  Ht 5\' 1"  (1.549 m)  Wt 108 lb (48.988 kg)  BMI 20.42 kg/m2  SpO2 99%  LMP 04/29/2014 Gen: Afebrile. No acute distress. Nontoxic in appearance, well-developed, well-nourished. Obese female., Does not speak [place]. HENT: AT. .  Bilateral eyes without injections or icterus. MMM. Bilateral nares without erythema or swelling. Throat without erythema or exudates.  Eyes:Pupils Equal Round Reactive to light, Extraocular movements intact, Conjunctiva without redness, discharge or icterus. Neck/lymph/endocrine: Supple, no lymphadenopathy, no thyromegaly CV: RRR no murmur appreciated, no edema, +2/4 P posterior tibialis pulses Chest: CTAB, no wheeze or crackles Abd: Soft. Flat. NTND. BS present. No Masses palpated.  MSK/right shoulder: No erythema, no soft tissue swelling, no effusions of right shoulder. Full range of motion, although with mild discomfort. Negative empty can test, positive Hawkins, negative O'Brien's. Neurovascularly intact distally. Skin: No rashes, purpura or petechiae.  Neuro: Normal gait. PERLA. EOMi. Alert. Oriented. Cranial nerves II through XII intact. Muscle strength 5/5 upper and lower extremity.  Psych: Normal affect, dress and demeanor. Normal speech. Normal thought content and judgment..      Assessment & Plan:  1. Prediabetes - Difficult to obtain history, do not have any records. We'll complete lab work today, and patient will need to follow-up depending upon any abnormalities found labs. - HgB A1c - Comprehensive metabolic panel - Lipid panel  2. Gastroesophageal reflux disease without esophagitis - Patient to continue PPI when necessary. Will need to be seen if symptoms increase.  3. Unintentional weight loss - After further investigation this seems to be more secondary to  dietary changes after being diagnosed as a diabetic. She did stop the metformin, but denied any side effects such as diarrhea that could have been causing her to lose weight. - CBC w/Diff - TSH  4. Right shoulder pain - Patient's family states that shoulder x-rays were normal, although I do not have access to these in the system. On exam today patient had some positive special test findings, but strength of the rotator was good. Offered referral to orthopedic specialist, patient would like to wait on this for now. Will do a trial of physical therapy, using pain as her guide. If physical therapy does not help, or seems to worsen her symptoms will refer to orthopedics.  - Ambulatory referral to Physical Therapy - cyclobenzaprine (FLEXERIL) 5 MG tablet; Take 1 tablet (5 mg total) by mouth 3 (three) times daily as needed for muscle spasms.  Dispense: 30 tablet; Refill: 0 - Four-week follow-up  Will need scheduled interpreter for future office visits.  Greater than 45 minutes was spent with patient, greater than 50% of that time was spent face-to-face with patient counseling and coordinating care.

## 2015-07-28 NOTE — Telephone Encounter (Signed)
LM on daughters cell to CB.  Okay per DPR.

## 2015-07-30 ENCOUNTER — Encounter: Payer: Self-pay | Admitting: Family Medicine

## 2015-07-31 NOTE — Telephone Encounter (Signed)
LM on daughters cell # 2 to CB.  Okay per DPR.

## 2015-08-03 ENCOUNTER — Encounter: Payer: Self-pay | Admitting: Family Medicine

## 2015-08-04 ENCOUNTER — Encounter: Payer: Self-pay | Admitting: Family Medicine

## 2015-08-04 NOTE — Telephone Encounter (Signed)
LM on daughters cell # 3 to CB to discuss lab results.  Unable to contact letter will be sent.

## 2015-08-06 NOTE — Telephone Encounter (Signed)
Patient's daughter contacted me and I gave her results and diet and fish oil supplement recommendations.  Pt's daughter aware of appt on 08/26/15.  Pt's daughter had no questions at this time.

## 2015-08-26 ENCOUNTER — Ambulatory Visit: Payer: 59 | Admitting: Family Medicine

## 2016-05-10 ENCOUNTER — Other Ambulatory Visit: Payer: Self-pay | Admitting: Obstetrics and Gynecology

## 2016-05-13 LAB — CYTOLOGY - PAP

## 2017-03-21 IMAGING — DX DG SHOULDER 2+V*R*
2 series · 2 of 2 positions shown · non-contrast
Comparison: None.

CLINICAL DATA: Initial evaluation for acute trauma, fall.

EXAM:
RIGHT SHOULDER - 2+ VIEW

[shoulder grashey]
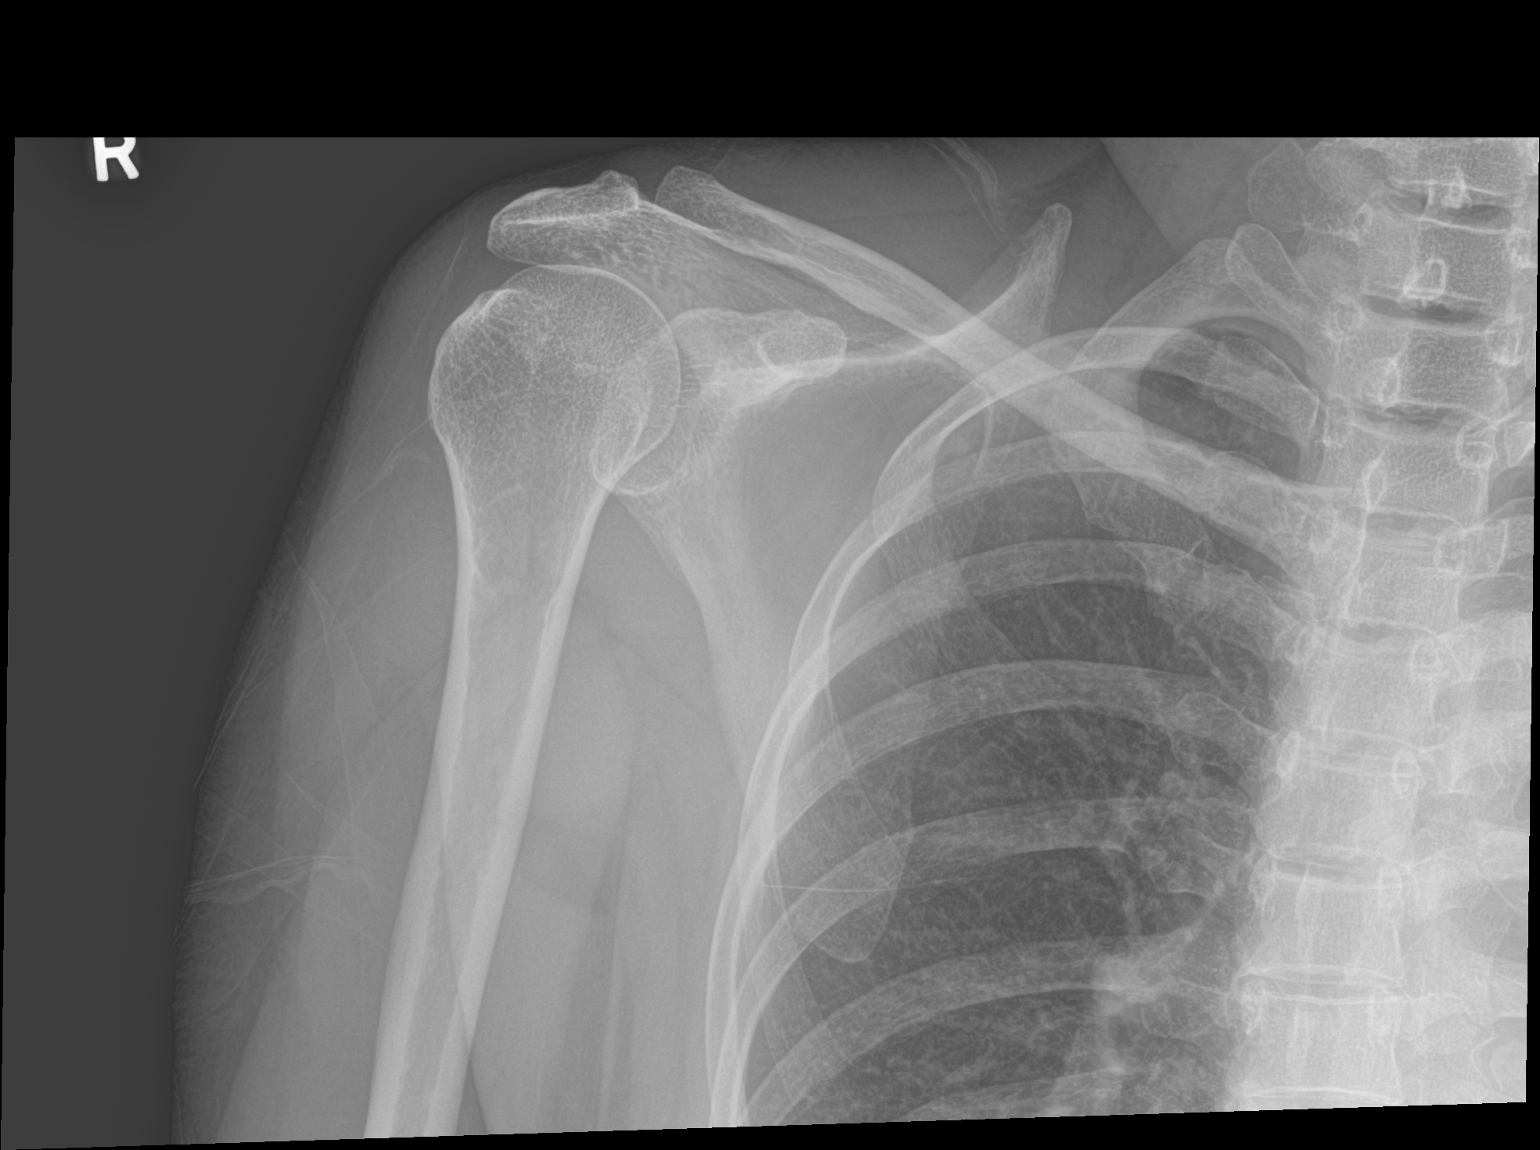

[shoulder y view]
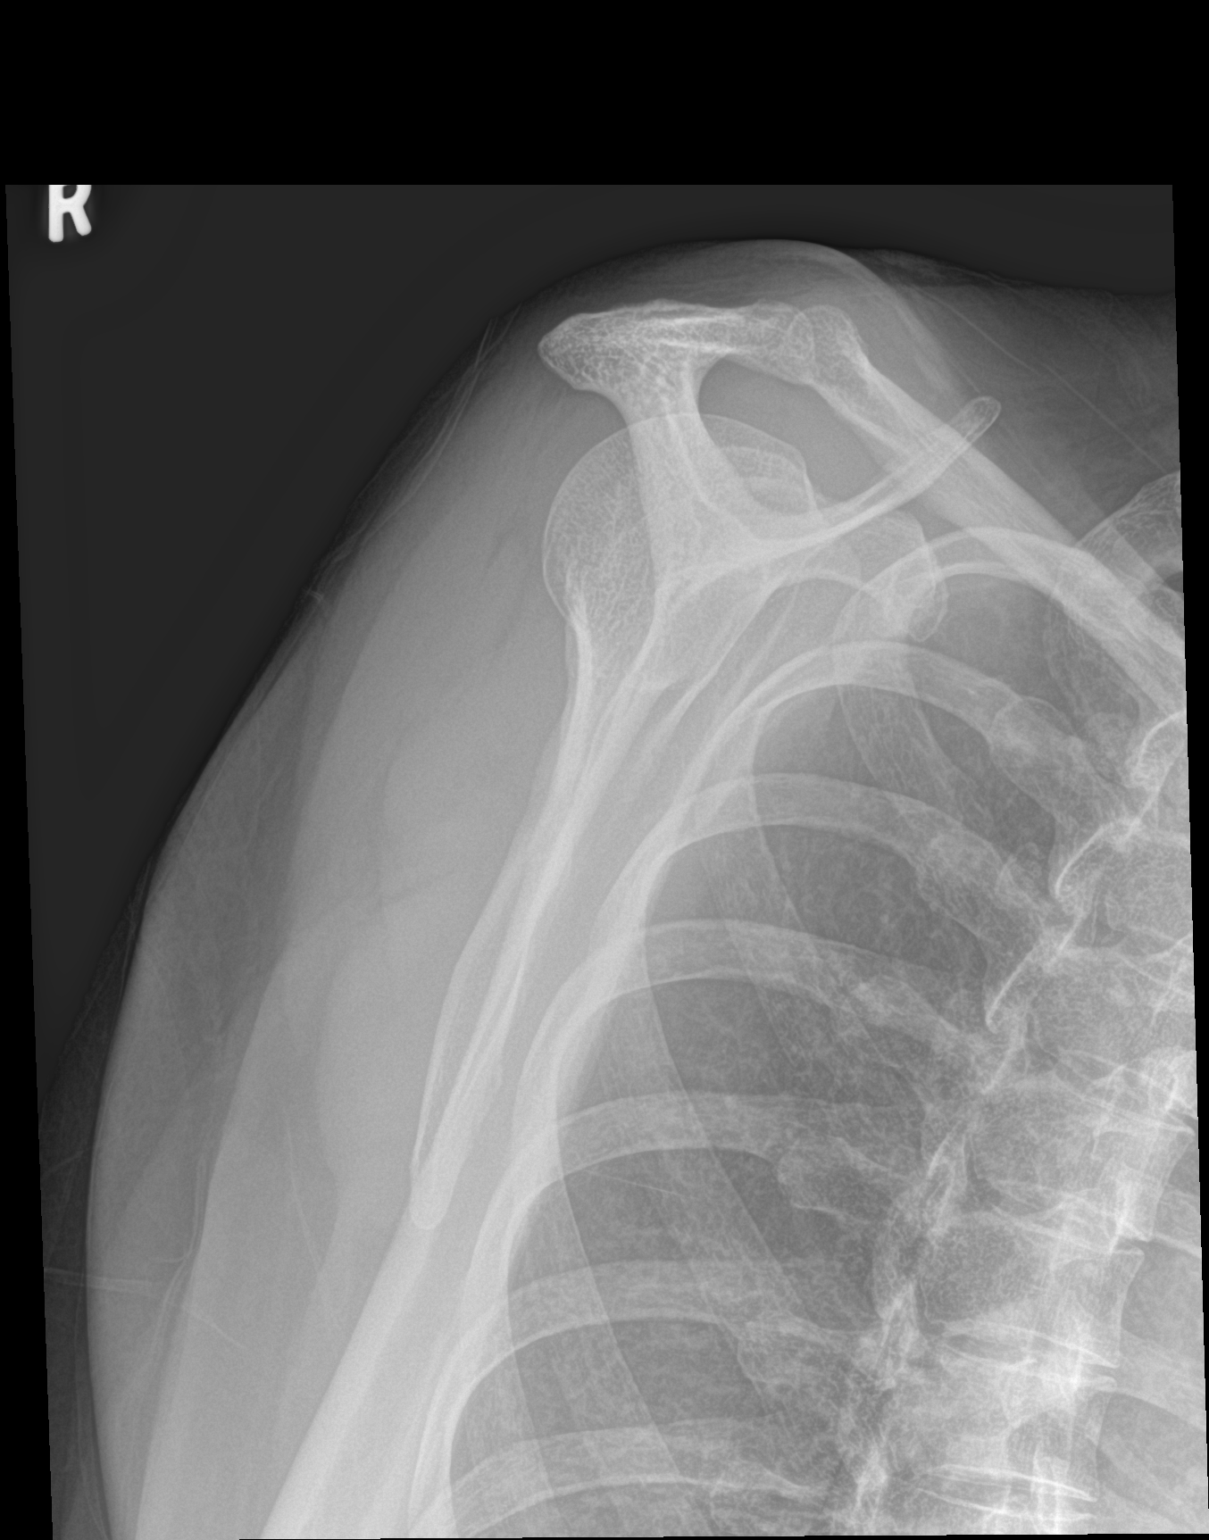

[2 of 2 positions shown; findings below may reference images not displayed]

FINDINGS: There is no evidence of fracture or dislocation. There is no
evidence of arthropathy or other focal bone abnormality. Soft
tissues are unremarkable.
IMPRESSION: No acute fracture or dislocation.
